# Patient Record
Sex: Male | Born: 1955 | Race: White | Hispanic: No | Marital: Married | Smoking: Never smoker
Health system: Southern US, Community
[De-identification: ages and names within clinical notes are randomized; demographics above are authoritative.]

## PROBLEM LIST (undated history)

## (undated) DIAGNOSIS — N200 Calculus of kidney: Secondary | ICD-10-CM

## (undated) DIAGNOSIS — I10 Essential (primary) hypertension: Secondary | ICD-10-CM

## (undated) DIAGNOSIS — E119 Type 2 diabetes mellitus without complications: Secondary | ICD-10-CM

## (undated) HISTORY — PX: TOTAL HIP ARTHROPLASTY: SHX124

---

## 2012-02-17 ENCOUNTER — Ambulatory Visit: Payer: Self-pay | Admitting: Internal Medicine

## 2021-03-04 ENCOUNTER — Encounter: Payer: Self-pay | Admitting: Internal Medicine

## 2021-03-04 ENCOUNTER — Encounter: Payer: Self-pay | Admitting: Emergency Medicine

## 2021-03-04 ENCOUNTER — Ambulatory Visit
Admission: EM | Admit: 2021-03-04 | Discharge: 2021-03-04 | Disposition: A | Discharge status: Home / Self Care | Payer: Medicare Other | Attending: Internal Medicine | Admitting: Internal Medicine

## 2021-03-04 ENCOUNTER — Other Ambulatory Visit: Payer: Self-pay

## 2021-03-04 ENCOUNTER — Ambulatory Visit (INDEPENDENT_AMBULATORY_CARE_PROVIDER_SITE_OTHER): Payer: Medicare Other

## 2021-03-04 DIAGNOSIS — N200 Calculus of kidney: Secondary | ICD-10-CM

## 2021-03-04 DIAGNOSIS — K5904 Chronic idiopathic constipation: Secondary | ICD-10-CM | POA: Diagnosis present

## 2021-03-04 DIAGNOSIS — I10 Essential (primary) hypertension: Secondary | ICD-10-CM | POA: Insufficient documentation

## 2021-03-04 DIAGNOSIS — E119 Type 2 diabetes mellitus without complications: Secondary | ICD-10-CM | POA: Insufficient documentation

## 2021-03-04 DIAGNOSIS — R109 Unspecified abdominal pain: Secondary | ICD-10-CM | POA: Diagnosis present

## 2021-03-04 HISTORY — DX: Calculus of kidney: N20.0

## 2021-03-04 HISTORY — DX: Essential (primary) hypertension: I10

## 2021-03-04 HISTORY — DX: Type 2 diabetes mellitus without complications: E11.9

## 2021-03-04 LAB — URINALYSIS, COMPLETE (UACMP) WITH MICROSCOPIC
Bilirubin Urine: NEGATIVE
Glucose, UA: 500 mg/dL — AB
Ketones, ur: NEGATIVE mg/dL
Leukocytes,Ua: NEGATIVE
Nitrite: NEGATIVE
Protein, ur: NEGATIVE mg/dL
Specific Gravity, Urine: 1.01 (ref 1.005–1.030)
pH: 5.5 (ref 5.0–8.0)

## 2021-03-04 NOTE — ED Provider Notes (Signed)
[facility]    CSN: [id number] Arrival date & time: 03/04/21  1516      History   Chief Complaint Chief Complaint  Patient presents with   Flank Pain   Abdominal Pain    HPI OSIAH HARING is a 65 y.o. male who presents with L flank pain radiating to his side. Denies abdominal pain, but has felt bloated. Has hx of chronic constipation. Only passes small amounts of stool. He ate grapes from his friends grapevine yesterday, but denies N/V/ D or fever. He has had similar pain several years ago and was renal stone. Does not recall if he ever had a colonosocopy    Past Medical History:  Diagnosis Date   Diabetes (HCC)    Hypertension    Kidney stones     Patient Active Problem List   Diagnosis Date Noted   Hypertension 03/04/2021   Diabetes (HCC) 03/04/2021   Kidney stones 03/04/2021    Past Surgical History:  Procedure Laterality Date   TOTAL HIP ARTHROPLASTY Right        Home Medications    Prior to Admission medications   Medication Sig Start Date End Date Taking? Authorizing Provider  amLODipine (NORVASC) 10 MG tablet daily. 03/02/19  Yes [provider]  apixaban (ELIQUIS) 2.5 MG TABS tablet Take 1 tablet by mouth 2 (two) times daily. 04/22/19  Yes [provider]  glipiZIDE (GLUCOTROL) 10 MG tablet every morning (before breakfast). 03/02/19  Yes [provider]  meloxicam (MOBIC) 15 MG tablet Take by mouth.    [provider]  metFORMIN (GLUCOPHAGE) 1000 MG tablet Take 1,000 mg by mouth 2 (two) times daily. 02/02/21   [provider]  simvastatin (ZOCOR) 40 MG tablet Take 40 mg by mouth at bedtime. 02/06/21   [provider]    Family History History reviewed. No pertinent family history.  Social History Social History   Tobacco Use   Smoking status: Never   Smokeless tobacco: Never  Vaping Use   Vaping Use: Never used  Substance Use Topics   Alcohol use: Not Currently   Drug use: Not  Currently     Allergies   Patient has no known allergies.   Review of Systems Review of Systems  Constitutional:  Negative for chills, diaphoresis and fever.  Gastrointestinal:  Positive for constipation. Negative for nausea and vomiting.  Genitourinary:  Positive for flank pain. Negative for dysuria, frequency and urgency.  Musculoskeletal:  Positive for back pain. Negative for gait problem.  Skin:  Negative for rash.  + constipation, R back pain radiating to his L side. Neg for fever, chills, sweats, N/V  Physical Exam Triage Vital Signs ED Triage Vitals  Enc Vitals Group     BP 03/04/21 1546 (!) 170/90     Pulse Rate 03/04/21 1546 99     Resp 03/04/21 1546 18     Temp 03/04/21 1546 98.3 F (36.8 C)     Temp Source 03/04/21 1546 Oral     SpO2 03/04/21 1546 99 %     Weight 03/04/21 1542 180 lb (81.6 kg)     Height 03/04/21 1542 5\' 9"  (1.753 m)     Head Circumference --      Peak Flow --      Pain Score 03/04/21 1541 0     Pain Loc --      Pain Edu? --      Excl. in GC? --    No data found.  Updated Vital Signs BP (!) 170/90 (BP Location: Left Arm)   Pulse 86   Temp 98.3 F (36.8 C) (Oral)   Resp 18   Ht 5\' 9"  (1.753 m)   Wt 180 lb (81.6 kg)   SpO2 99%   BMI 26.58 kg/m   Visual Acuity Right Eye Distance:   Left Eye Distance:   Bilateral Distance:    Right Eye Near:   Left Eye Near:    Bilateral Near:     Physical Exam Physical Exam Vitals and nursing note reviewed.  Constitutional:      General: he is not in acute distress.    Appearance: She is not toxic-appearing.  HENT:     Head: Normocephalic.     Right Ear: External ear normal.     Left Ear: External ear normal.  Eyes:     General: No scleral icterus.    Conjunctiva/sclera: Conjunctivae normal.  Pulmonary:     Effort: Pulmonary effort is normal.  Abdominal:     General: Bowel sounds are normal.     Palpations: Abdomen is soft. There is no mass.     Tenderness: There is no guarding or  rebound.     Comments: - CVA tenderness   Musculoskeletal:        General: Normal range of motion.     Cervical back: Neck supple.     Comments: BACK- with tenderness on  mid flank and lateral area right below his last right on the R.     General: Skin is warm and dry.     Findings: No rash.  Neurological:     Mental Status: he is alert and oriented to person, place, and time.     Gait: Gait normal.  Psychiatric:        Mood and Affect: Mood normal.        Behavior: Behavior normal.        Thought Content: Thought content normal.        Judgment: Judgment normal.    UC Treatments / Results  Labs (all labs ordered are listed, but only abnormal results are displayed) Labs Reviewed  URINALYSIS, COMPLETE (UACMP) WITH MICROSCOPIC - Abnormal; Notable for the following components:      Result Value   Glucose, UA 500 (*)    Hgb urine dipstick TRACE (*)    Bacteria, UA FEW (*)    All other components within normal limits  URINE CULTURE    EKG   Radiology DG Abd 2 Views  Result Date: 03/04/2021 CLINICAL DATA:  Right-sided abdominal pain EXAM: ABDOMEN - 2 VIEW COMPARISON:  None. FINDINGS: Scattered large and small bowel gas is noted. Mild retained fecal material is seen without obstructive change. Small lower pole right renal stone is noted measuring approximately 4 mm. No definitive ureteral stones are seen. Scattered phleboliths are noted within the pelvis. Right hip replacement is noted. Degenerative changes of lumbar spine are seen. IMPRESSION: Lower pole right renal stone. No definitive ureteral stones are noted. Electronically Signed   By: 05/04/2021 M.D.   On: 03/04/2021 16:50    Procedures Procedures (including critical care time)  Medications Ordered in UC Medications - No data to display  Initial Impression / Assessment and Plan / UC Course  I have reviewed the triage vital signs and the nursing notes. Pertinent labs & imaging results that were available during my care  of the patient were reviewed by me and considered in my medical decision making (see  chart for details). Has R flank pain that is provoked with muscular palpation, but not CVA testing. I dont believe he is having renal colic right now. Has chronic constipation. See instructions.     Final Clinical Impressions(s) / UC Diagnoses   Final diagnoses:  Right flank pain  Chronic idiopathic constipation  Renal stone     Discharge Instructions      Try drinking a whole bottle Magnesium Citrate tonight to clean out your bowels.  Then to prevent future constipation, drink 8 ounces of prune juice every morning and if this does not help, then take Magnesium Citrate tablets 400 mg at bed time.   If our colonoscopy is not up to date, you need to make sure to have it done.  If emptying your bowels does not help your flank pain, then to to ER to have more test done.       ED Prescriptions   None    PDMP not reviewed this encounter.   Garey Ham, PA-C 03/04/21 1712

## 2021-03-04 NOTE — ED Triage Notes (Addendum)
Pt presents today with c/o right sided flank pain that radiates to front right abdomen. This pain began last night around 12 midnight. Denies n/v/d. He does report eating grapes off the vine yesterday. He lives in [place] and is visiting. Also, reports hx of kidney stones.

## 2021-03-04 NOTE — Discharge Instructions (Addendum)
Try drinking a whole bottle Magnesium Citrate tonight to clean out your bowels.  Then to prevent future constipation, drink 8 ounces of prune juice every morning and if this does not help, then take Magnesium Citrate tablets 400 mg at bed time.   If our colonoscopy is not up to date, you need to make sure to have it done.  If emptying your bowels does not help your flank pain, then to to ER to have more test done.

## 2021-03-06 LAB — URINE CULTURE: Culture: <10000 — AB

## 2022-09-20 IMAGING — CR DG ABDOMEN 2V
4 series · 4 of 4 positions shown · non-contrast
Comparison: None.

CLINICAL DATA: Right-sided abdominal pain

EXAM:
ABDOMEN - 2 VIEW

[abdomen erect (1 of 2)]
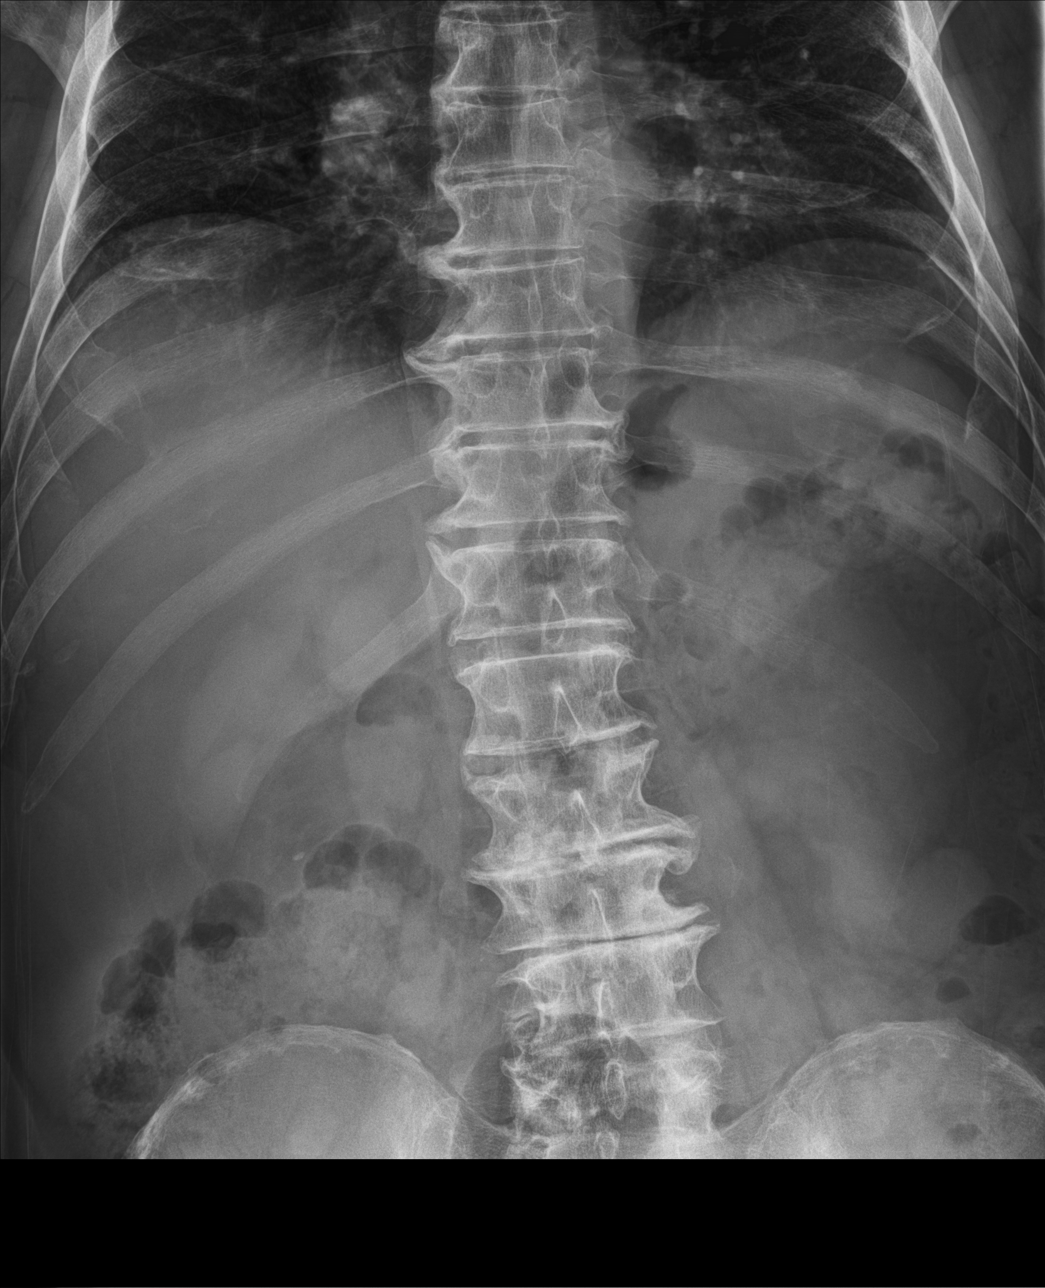

[abdomen erect (2 of 2)]
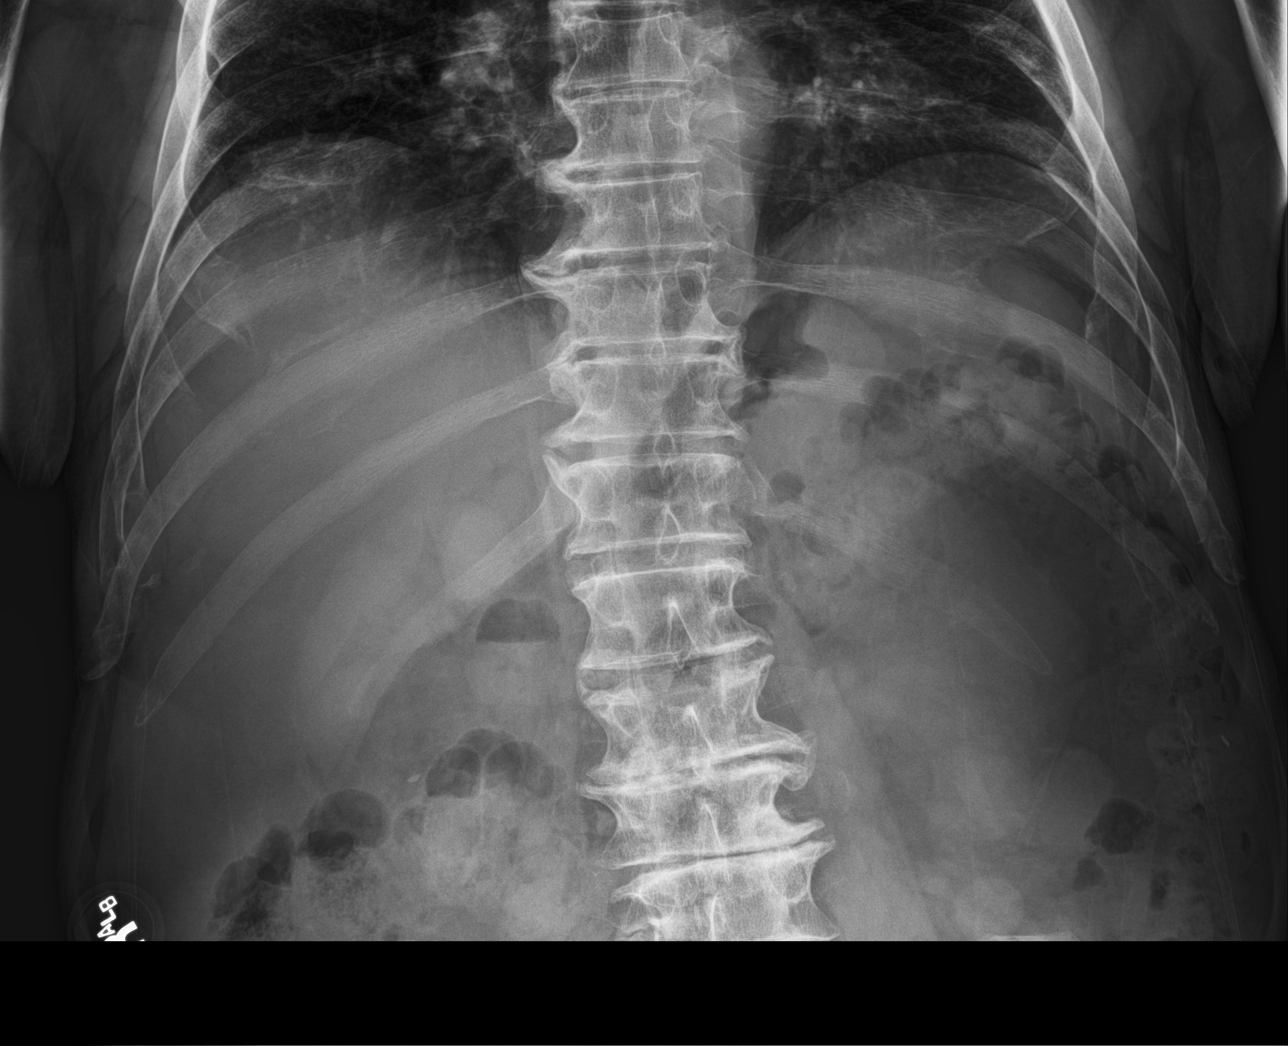

[abdomen supine (1 of 2)]
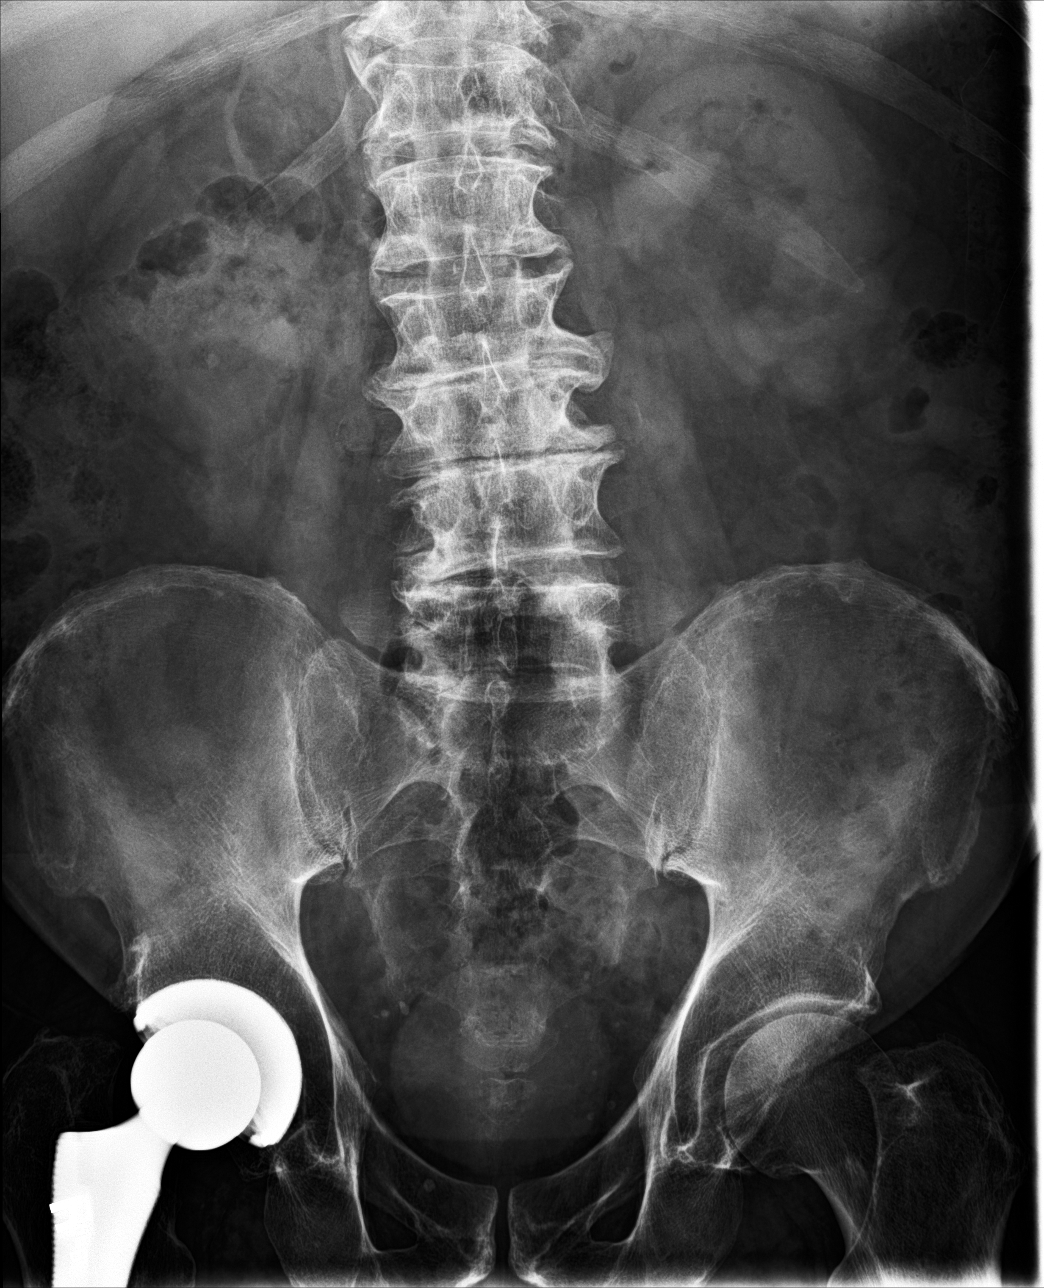

[abdomen supine (2 of 2)]
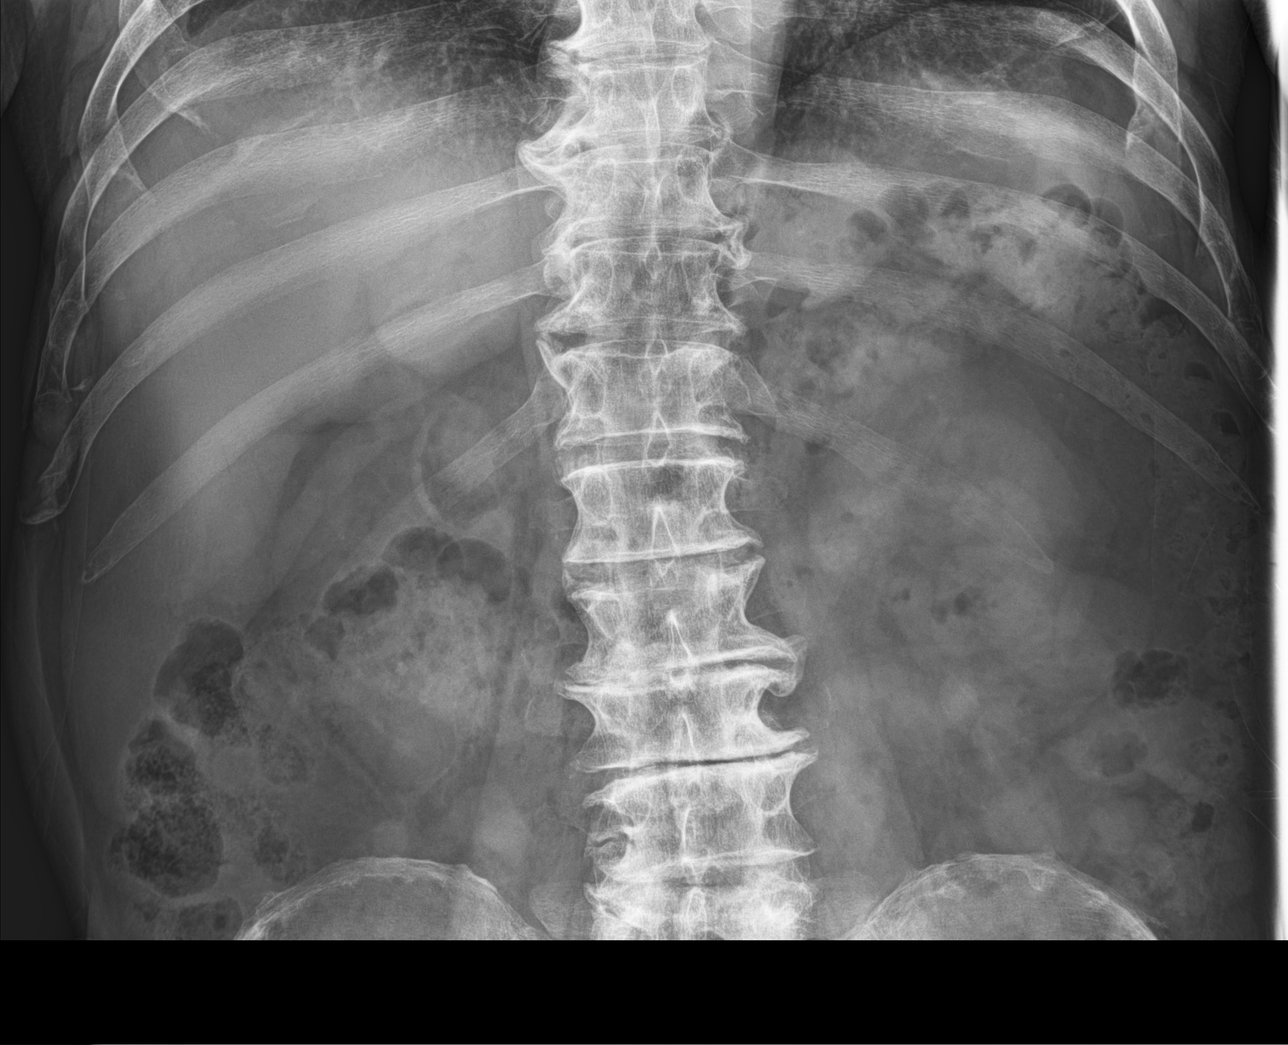

[4 of 4 positions shown; findings below may reference images not displayed]

FINDINGS: Scattered large and small bowel gas is noted. Mild retained fecal
material is seen without obstructive change. Small lower pole right
renal stone is noted measuring approximately 4 mm. No definitive
ureteral stones are seen. Scattered phleboliths are noted within the
pelvis. Right hip replacement is noted. Degenerative changes of
lumbar spine are seen.
IMPRESSION: Lower pole right renal stone. No definitive ureteral stones are
noted.
